# Patient Record
Sex: Female | Born: 2000 | Race: White | Hispanic: No | Marital: Single | Smoking: Never smoker
Health system: Southern US, Community
[De-identification: ages and names within clinical notes are randomized; demographics above are authoritative.]

## PROBLEM LIST (undated history)

## (undated) DIAGNOSIS — K219 Gastro-esophageal reflux disease without esophagitis: Secondary | ICD-10-CM

## (undated) DIAGNOSIS — T7840XA Allergy, unspecified, initial encounter: Secondary | ICD-10-CM

## (undated) DIAGNOSIS — R51 Headache: Secondary | ICD-10-CM

## (undated) DIAGNOSIS — R519 Headache, unspecified: Secondary | ICD-10-CM

---
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
## 2019-07-19 ENCOUNTER — Other Ambulatory Visit: Payer: Self-pay

## 2019-07-19 ENCOUNTER — Encounter: Payer: Self-pay | Admitting: Emergency Medicine

## 2019-07-19 ENCOUNTER — Ambulatory Visit
Admission: EM | Admit: 2019-07-19 | Discharge: 2019-07-19 | Disposition: A | Discharge status: Home / Self Care | Payer: 59 | Attending: Family Medicine | Admitting: Family Medicine

## 2019-07-19 DIAGNOSIS — R1030 Lower abdominal pain, unspecified: Secondary | ICD-10-CM

## 2019-07-19 DIAGNOSIS — M545 Low back pain: Secondary | ICD-10-CM

## 2019-07-19 DIAGNOSIS — Z3202 Encounter for pregnancy test, result negative: Secondary | ICD-10-CM

## 2019-07-19 LAB — URINALYSIS, COMPLETE (UACMP) WITH MICROSCOPIC
Bilirubin Urine: NEGATIVE
Glucose, UA: NEGATIVE mg/dL
Hgb urine dipstick: NEGATIVE
Ketones, ur: NEGATIVE mg/dL
Leukocytes,Ua: NEGATIVE
Nitrite: NEGATIVE
Protein, ur: NEGATIVE mg/dL
Specific Gravity, Urine: 1.015 (ref 1.005–1.030)
pH: 7 (ref 5.0–8.0)

## 2019-07-19 LAB — CBC WITH DIFFERENTIAL/PLATELET
Abs Immature Granulocytes: 0.02 10*3/uL (ref 0.00–0.07)
Basophils Absolute: 0.1 10*3/uL (ref 0.0–0.1)
Basophils Relative: 1 %
Eosinophils Absolute: 0.3 10*3/uL (ref 0.0–0.5)
Eosinophils Relative: 5 %
HCT: 41 % (ref 36.0–46.0)
Hemoglobin: 13.4 g/dL (ref 12.0–15.0)
Immature Granulocytes: 0 %
Lymphocytes Relative: 47 %
Lymphs Abs: 2.8 10*3/uL (ref 0.7–4.0)
MCH: 28.6 pg (ref 26.0–34.0)
MCHC: 32.7 g/dL (ref 30.0–36.0)
MCV: 87.6 fL (ref 80.0–100.0)
Monocytes Absolute: 0.4 10*3/uL (ref 0.1–1.0)
Monocytes Relative: 7 %
Neutro Abs: 2.4 10*3/uL (ref 1.7–7.7)
Neutrophils Relative %: 40 %
Platelets: 150 10*3/uL (ref 150–400)
RBC: 4.68 MIL/uL (ref 3.87–5.11)
RDW: 13.8 % (ref 11.5–15.5)
WBC: 6 10*3/uL (ref 4.0–10.5)
nRBC: 0 % (ref 0.0–0.2)

## 2019-07-19 LAB — COMPREHENSIVE METABOLIC PANEL
ALT: 26 U/L (ref 0–44)
AST: 23 U/L (ref 15–41)
Albumin: 5 g/dL (ref 3.5–5.0)
Alkaline Phosphatase: 71 U/L (ref 38–126)
Anion gap: 12 (ref 5–15)
BUN: 15 mg/dL (ref 6–20)
CO2: 23 mmol/L (ref 22–32)
Calcium: 9.7 mg/dL (ref 8.9–10.3)
Chloride: 102 mmol/L (ref 98–111)
Creatinine, Ser: 0.82 mg/dL (ref 0.44–1.00)
GFR calc Af Amer: >60 mL/min (ref >60)
GFR calc non Af Amer: >60 mL/min (ref >60)
Glucose, Bld: 85 mg/dL (ref 70–99)
Potassium: 3.9 mmol/L (ref 3.5–5.1)
Sodium: 137 mmol/L (ref 135–145)
Total Bilirubin: 1 mg/dL (ref 0.3–1.2)
Total Protein: 7.4 g/dL (ref 6.5–8.1)

## 2019-07-19 LAB — PREGNANCY, URINE: Preg Test, Ur: NEGATIVE

## 2019-07-19 LAB — WET PREP, GENITAL
Clue Cells Wet Prep HPF POC: NONE SEEN
Sperm: NONE SEEN
Trich, Wet Prep: NONE SEEN
Yeast Wet Prep HPF POC: NONE SEEN

## 2019-07-19 MED ORDER — POLYETHYLENE GLYCOL 3350 17 GM/SCOOP PO POWD: ORAL | 0 refills | Status: AC

## 2019-07-19 NOTE — ED Triage Notes (Signed)
Pt c/o lower mid abdominal pain, lower back pain. Started about 3 days ago. She has had nausea, vomiting (X1). Also having white vaginal discharge. denies fever, urinary symptoms. Pt has not had a period in 3 months but has never been sexually active.

## 2019-07-19 NOTE — Discharge Instructions (Signed)
Miralax as directed.  Eat small, frequent meals.  Consider seeing GI.  Take care  Dr. Lacinda Axon

## 2019-07-20 NOTE — ED Provider Notes (Signed)
[facility]    CSN: [id number] Arrival date & time: 07/19/19  1522   History   Chief Complaint Chief Complaint  Patient presents with  . Abdominal Pain   HPI  18 year old female presents with abdominal pain and back pain.  Patient reports a 3-day history of diffuse lower abdominal pain.  Associated back pain.  She reports associated nausea and one episode of emesis.  Denies fever.  Denies urinary symptoms.  Patient states that she is not sexually active and never has been.  She reports having some vaginal discharge.  Last menstrual cycle was 3 months ago.  She states that she has never had regular menstrual cycles.  Exercises frequently.  Mother was available via phone during history per patient request.  Mother states that she eats very little.  Mother believes that she has lost weight.  Patient states that she has only ate one small meal today.  Patient is concerned about her symptomatology.  Mother is also concerned.  Mother requesting blood work today.  No known inciting factor.  No known exacerbating or relieving factors.  No other complaints.  Past Medical History:  Diagnosis Date  . Allergy    pollen  . GERD (gastroesophageal reflux disease)   . Headache    sinuses   Past Surgical History:  Procedure Laterality Date  . ETHMOIDECTOMY Left 04/28/2015   Procedure: ETHMOIDECTOMY;  Surgeon: Vernie Murders, MD;  Location: [facility];  Service: ENT;  Laterality: Left;  . EXCISION CHONCHA BULLOSA Bilateral 04/28/2015   Procedure: EXCISION CHONCHA BULLOSA;  Surgeon: Vernie Murders, MD;  Location: [facility];  Service: ENT;  Laterality: Bilateral;  . FRONTAL SINUS EXPLORATION Bilateral 04/28/2015   Procedure: FRONTAL SINUS EXPLORATION;  Surgeon: Vernie Murders, MD;  Location: [facility];  Service: ENT;  Laterality: Bilateral;  . IMAGE GUIDED SINUS SURGERY N/A 04/28/2015   Procedure: IMAGE GUIDED SINUS SURGERY;  Surgeon: Vernie Murders, MD;  Location: [facility];  Service: ENT;  Laterality: N/A;  GAVE DISK TO CECE 8-10 KP  . MAXILLARY ANTROSTOMY Left 04/28/2015   Procedure: MAXILLARY ANTROSTOMY;  Surgeon: Vernie Murders, MD;  Location: [facility];  Service: ENT;  Laterality: Left;  . SEPTOPLASTY N/A 04/28/2015   Procedure: SEPTOPLASTY;  Surgeon: Vernie Murders, MD;  Location: [facility];  Service: ENT;  Laterality: N/A;    OB History   No obstetric history on file.      Home Medications    Prior to Admission medications   Medication Sig Start Date End Date Taking? Authorizing Provider  polyethylene glycol powder (GLYCOLAX/MIRALAX) 17 GM/SCOOP powder 17 g once or twice daily for constipation. 07/19/19   Tommie Sams, DO  cetirizine (ZYRTEC) 10 MG tablet Take 10 mg by mouth daily.  07/19/19  [provider]  norethindrone-ethinyl estradiol (JUNEL FE,GILDESS FE,LOESTRIN FE) 1-20 MG-MCG tablet Take 1 tablet by mouth daily.  07/19/19  [provider]  triamcinolone (NASACORT) 55 MCG/ACT AERO nasal inhaler Place 2 sprays into the nose daily.  07/19/19  [provider]   Family Hx: IBS - Mother  Social History Social History   Tobacco Use  . Smoking status: Never Smoker  . Smokeless tobacco: Never Used  Substance Use Topics  . Alcohol use: No  . Drug use: No     Allergies   Red dye   Review of Systems Review of Systems  Constitutional: Positive for appetite change. Negative for fever.  Gastrointestinal: Positive for abdominal pain, nausea and  vomiting.  Musculoskeletal: Positive for back pain.   Physical Exam Triage Vital Signs ED Triage Vitals  Enc Vitals Group     BP 07/19/19 1542 110/71     Pulse Rate 07/19/19 1542 (!) 50     Resp 07/19/19 1542 16     Temp 07/19/19 1542 (!) 97.5 F (36.4 C)     Temp Source 07/19/19 1542 Oral     SpO2 07/19/19 1542 100 %     Weight 07/19/19 1539 145 lb (65.8 kg)     Height 07/19/19 1539 5\' 11"  (1.803 m)     Head Circumference --       Peak Flow --      Pain Score 07/19/19 1537 6     Pain Loc --      Pain Edu? --      Excl. in [place]? --    No data found.  Updated Vital Signs BP 110/71 (BP Location: Left Arm)   Pulse (!) 50   Temp (!) 97.5 F (36.4 C) (Oral)   Resp 16   Ht 5\' 11"  (1.803 m)   Wt 65.8 kg   LMP 04/17/2019 (Approximate)   SpO2 100%   BMI 20.22 kg/m   Visual Acuity Right Eye Distance:   Left Eye Distance:   Bilateral Distance:    Right Eye Near:   Left Eye Near:    Bilateral Near:     Physical Exam Vitals signs and nursing note reviewed.  Constitutional:      General: She is not in acute distress.    Appearance: Normal appearance. She is not ill-appearing.  HENT:     Head: Normocephalic and atraumatic.     Nose: Nose normal.     Mouth/Throat:     Pharynx: Oropharynx is clear. No oropharyngeal exudate or posterior oropharyngeal erythema.  Eyes:     General:        Right eye: No discharge.        Left eye: No discharge.     Conjunctiva/sclera: Conjunctivae normal.  Neck:     Musculoskeletal: Neck supple.  Cardiovascular:     Rate and Rhythm: Regular rhythm. Bradycardia present.  Pulmonary:     Effort: Pulmonary effort is normal.     Breath sounds: Normal breath sounds. No wheezing, rhonchi or rales.  Abdominal:     General: There is no distension.     Palpations: Abdomen is soft.     Comments: Tenderness around the umbilicus and throughout the lower abdomen.  Lymphadenopathy:     Cervical: No cervical adenopathy.  Skin:    General: Skin is warm.     Findings: No rash.  Neurological:     General: No focal deficit present.     Mental Status: She is alert and oriented to person, place, and time.  Psychiatric:        Mood and Affect: Mood normal.        Behavior: Behavior normal.    UC Treatments / Results  Labs (all labs ordered are listed, but only abnormal results are displayed) Labs Reviewed  WET PREP, GENITAL - Abnormal; Notable for the following components:       Result Value   WBC, Wet Prep HPF POC MODERATE (*)    All other components within normal limits  URINALYSIS, COMPLETE (UACMP) WITH MICROSCOPIC - Abnormal; Notable for the following components:   Color, Urine STRAW (*)    Bacteria, UA FEW (*)    All other components within normal  limits  PREGNANCY, URINE  CBC WITH DIFFERENTIAL/PLATELET  COMPREHENSIVE METABOLIC PANEL    EKG   Radiology No results found.  Procedures Procedures (including critical care time)  Medications Ordered in UC Medications - No data to display  Initial Impression / Assessment and Plan / UC Course  I have reviewed the triage vital signs and the nursing notes.  Pertinent labs & imaging results that were available during my care of the patient were reviewed by me and considered in my medical decision making (see chart for details).    18 year old female presents with ongoing abdominal pain and back pain.  Urinalysis not consistent with UTI.  Patient has no urinary symptoms.  Wet prep negative.  Laboratory studies obtained and were normal. All findings reassuring. Advised PRN Miralax (has BM's approximately 2 times a week).   Recommended seeing GI.   Final Clinical Impressions(s) / UC Diagnoses   Final diagnoses:  Lower abdominal pain     Discharge Instructions     Miralax as directed.  Eat small, frequent meals.  Consider seeing GI.  Take care  Dr. Adriana Simasook    ED Prescriptions    Medication Sig Dispense Auth. Provider   polyethylene glycol powder (GLYCOLAX/MIRALAX) 17 GM/SCOOP powder 17 g once or twice daily for constipation. 500 g Tommie Samsook, Isaiha Asare G, DO     PDMP not reviewed this encounter.   Tommie SamsCook, Takhia Spoon G, [place]DO 07/20/19 1349

## 2019-07-22 ENCOUNTER — Encounter: Payer: Self-pay | Admitting: Gastroenterology

## 2019-07-22 ENCOUNTER — Ambulatory Visit (INDEPENDENT_AMBULATORY_CARE_PROVIDER_SITE_OTHER): Payer: 59 | Admitting: Gastroenterology

## 2019-07-22 ENCOUNTER — Other Ambulatory Visit (INDEPENDENT_AMBULATORY_CARE_PROVIDER_SITE_OTHER): Payer: 59

## 2019-07-22 VITALS — BP 86/60 | HR 51 | Temp 97.4°F | Ht 71.0 in | Wt 147.0 lb

## 2019-07-22 DIAGNOSIS — K5909 Other constipation: Secondary | ICD-10-CM

## 2019-07-22 DIAGNOSIS — R1030 Lower abdominal pain, unspecified: Secondary | ICD-10-CM

## 2019-07-22 LAB — T4, FREE: Free T4: 0.87 ng/dL (ref 0.60–1.60)

## 2019-07-22 LAB — TSH: TSH: 2.4 u[IU]/mL (ref 0.40–5.00)

## 2019-07-22 NOTE — Progress Notes (Signed)
[facility] Consult Note:  History: Laura Gilbert 07/22/2019  Referring provider: [facility], [place]  Reason for consult/chief complaint: Constipation (pt reports stools that are normal but infrequent; also reports bloating and gas; sometimes feels like she has to have a bm but doesn't; has lower abdominal pain after eating; )   Subjective  HPI:  This is a very pleasant 18 year old woman self-referred and accompanied by her mother for several months of constipation and abdominal pain.  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  Over the summer she began having slowly worsening constipation, where she might go up to a week without a BM.  This would cause crampy lower abdominal pain bloating and intermittent nausea without vomiting.  The pain is worse after eating, and she denies rectal bleeding. They went to an urgent care near their home where they sometimes get primary care, since their main primary care provider is in [place].  She had some lab work, pregnancy test and a urinalysis done there lately, we do not have those records.  MiraLAX was started last week without much improvement so far. Also, she has not had her menses in a few months, and says she is not sexually active so could not be pregnant. 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 She will feel the need for BM wraps every couple of days, but will sit and feel like nothing will pass.   ROS:  Review of Systems  Constitutional: Negative for appetite change and unexpected weight change.  HENT: Negative for mouth sores and voice change.   Eyes: Negative  for pain and redness.  Respiratory: Negative for cough and shortness of breath.   Cardiovascular: Negative for chest pain and palpitations.  Genitourinary: Negative for dysuria and hematuria.       Cessation of menses in the last few months as noted above  Musculoskeletal: Negative for arthralgias and myalgias.  Skin: Negative for pallor and rash.  Neurological: Negative for weakness and headaches.  Hematological: Negative for adenopathy.     Past Medical History: Past Medical History:  Diagnosis Date  . Allergy    pollen  . GERD (gastroesophageal reflux disease)   . Headache    sinuses     Past Surgical History: Past Surgical History:  Procedure Laterality Date  . ETHMOIDECTOMY Left 04/28/2015   Procedure: ETHMOIDECTOMY;  Surgeon: Margaretha Sheffield, MD;  Location: [place];  Service: ENT;  Laterality: Left;  . EXCISION CHONCHA BULLOSA Bilateral 04/28/2015   Procedure: EXCISION CHONCHA BULLOSA;  Surgeon: Margaretha Sheffield, MD;  Location: [place];  Service: ENT;  Laterality: Bilateral;  . FRONTAL SINUS EXPLORATION Bilateral 04/28/2015   Procedure: FRONTAL SINUS EXPLORATION;  Surgeon: Margaretha Sheffield, MD;  Location: [place];  Service: ENT;  Laterality: Bilateral;  . IMAGE GUIDED SINUS SURGERY N/A 04/28/2015   Procedure: IMAGE GUIDED SINUS SURGERY;  Surgeon: Margaretha Sheffield, MD;  Location: [place];  Service: ENT;  Laterality: N/A;  GAVE DISK TO CE[facility] 8-10 KP  . MAXILLARY ANTROSTOMY Left 04/28/2015   Procedure: MAXILLARY ANTROSTOMY;  Surgeon: Margaretha Sheffield, MD;  Location: [place];  Service: ENT;  Laterality: Left;  . SEPTOPLASTY N/A 04/28/2015   Procedure: SEPTOPLASTY;  Surgeon: Vernie Murders, MD;  Location: [facility];  Service: ENT;  Laterality: N/A;     Family History: History reviewed. No pertinent family history. Mother has IBS  Social History: Social History   Socioeconomic History  . Marital status: Single    Spouse name:  Not on file  . Number of children: Not on file  . Years of education: Not on file  . Highest education level: Not on file  Occupational History  . Not on file  Social Needs  . Financial resource strain: Not on file  . Food insecurity    Worry: Not on file    Inability: Not on file  . Transportation needs    Medical: Not on file    Non-medical: Not on file  Tobacco Use  . Smoking status: Never Smoker  . Smokeless tobacco: Never Used  Substance and Sexual Activity  . Alcohol use: No  . Drug use: No  . Sexual activity: Not on file  Lifestyle  . Physical activity    Days per week: Not on file    Minutes per session: Not on file  . Stress: Not on file  Relationships  . Social Musician on phone: Not on file    Gets together: Not on file    Attends religious service: Not on file    Active member of club or organization: Not on file    Attends meetings of clubs or organizations: Not on file    Relationship status: Not on file  Other Topics Concern  . Not on file  Social History Narrative  . Not on file    Allergies: Allergies  Allergen Reactions  . Red Dye Other (See Comments)    Aggitation, anger    Outpatient Meds: Current Outpatient Medications  Medication Sig Dispense Refill  . polyethylene glycol powder (GLYCOLAX/MIRALAX) 17 GM/SCOOP powder 17 g once or twice daily for constipation. 500 g 0  . Probiotic Product (PROBIOTIC DAILY PO) Take 1 capsule by mouth daily.     No current facility-administered medications for this visit.       ___________________________________________________________________ Objective   Exam:  BP (!) 86/60   Pulse (!) 51   Temp (!) 97.4 F (36.3 C)   Ht 5\' 11"  (1.803 m)   Wt 147 lb (66.7 kg)   BMI 20.50 kg/m  Her mother was present for the entire visit, including physical exam.  General: Well-appearing, pleasant and conversational.  Eyes: sclera anicteric, no redness  ENT: oral mucosa moist without lesions,  no cervical or supraclavicular lymphadenopathy  CV: RRR without murmur, S1/S2, no JVD, no peripheral edema  Resp: clear to auscultation bilaterally, normal RR and effort noted  GI: soft, lower tenderness, with active bowel sounds. No guarding or palpable organomegaly noted.  No distention   skin; warm and dry, no rash or jaundice noted  Neuro: awake, alert and oriented x 3. Normal gross motor function and fluent speech Rectal: Normal external.  Normal sphincter tone without tenderness, no fissure or palpable internal lesion.  When asked to constrict muscles to hold in bowel movement, she would bear down, and vice versa. Normal sphincter contraction was felt as well as puborectalis.  She has rectal descent with Valsalva.   Assessment: Encounter Diagnoses  Name Primary?  . Chronic constipation Yes  . Lower abdominal pain     This seems most likely to be  functional abdominal pain and constipation, possibly with an element of dyssynergy defecation.  Sounds like there may be a significant stress component, which might also account for her recent loss of menses.  Strongly recommended she consult with her primary care provider about that.  Plan: TSH and free T4 Magnesium citrate bowel purge 2 days later, start samples of Zelnorm 6 mg once a day. Call in a week with a symptom update.  If relief of constipation but no improvement in abdominal pain with follow-up, possibly pursue CT scan abdomen and pelvis with repeat pregnancy test beforehand.  Total time 60 minutes, over half spent face-to-face.    Charlie PitterHenry L Danis III  CC: Referring provider noted above

## 2019-07-22 NOTE — Patient Instructions (Signed)
If you are age 19 or older, your body mass index should be between 23-30. Your Body mass index is 20.5 kg/m. If this is out of the aforementioned range listed, please consider follow up with your Primary Care Provider.  If you are age 59 or younger, your body mass index should be between 19-25. Your Body mass index is 20.5 kg/m. If this is out of the aformentioned range listed, please consider follow up with your Primary Care Provider.   Your provider has requested that you go to the basement level for lab work before leaving today. Press "B" on the elevator. The lab is located at the first door on the left as you exit the elevator.  Purchase over the counter Magnesium citrate, 1 bottle Drink 1/2 a bottle tonight  Drink 1/2 bottle Thursday morning.  Friday start Zelnorm, 1 tablet every morning.  Medication Samples have been provided to the patient.  Drug name: ZELNORM       Strength: 6mg          Qty: 30  LOT: [id number]  Exp.Date: 04-2020  Dosing instructions: Start with one tablet a day  Call in a week with an update ask for [facility] RN or send [place] a [employer]. [phone].   The patient has been instructed regarding the correct time, dose, and frequency of taking this medication, including desired effects and most common side effects.   Elias Else 9:50 AM 07/22/2019

## 2019-07-27 ENCOUNTER — Ambulatory Visit: Payer: 59 | Admitting: Nurse Practitioner

## 2019-07-29 ENCOUNTER — Telehealth: Payer: Self-pay | Admitting: Gastroenterology

## 2019-07-29 NOTE — Telephone Encounter (Signed)
Left message to call back  

## 2019-07-31 NOTE — Telephone Encounter (Signed)
Left 2nd message for the patient to call back.  

## 2019-08-04 NOTE — Telephone Encounter (Signed)
Called the patient for the third time, the call was forwarded to VM. This RN left third message for the patient to call back.

## 2022-09-12 ENCOUNTER — Ambulatory Visit (INDEPENDENT_AMBULATORY_CARE_PROVIDER_SITE_OTHER): Payer: 59 | Admitting: Podiatry

## 2022-09-12 ENCOUNTER — Other Ambulatory Visit: Payer: Self-pay | Admitting: Podiatry

## 2022-09-12 ENCOUNTER — Encounter: Payer: Self-pay | Admitting: Podiatry

## 2022-09-12 DIAGNOSIS — B351 Tinea unguium: Secondary | ICD-10-CM | POA: Diagnosis not present

## 2022-09-12 MED ORDER — TERBINAFINE HCL 250 MG PO TABS: 250.0000 mg | ORAL_TABLET | Freq: Every day | ORAL | 0 refills | Status: AC

## 2022-09-12 NOTE — Progress Notes (Signed)
Subjective:   Patient ID: Laura Gilbert, female   DOB: 21 y.o.   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  Patient's not been seen for a number of years has had nail trauma in the past and used to play tennis and basketball.  Patient does not smoke likes to be active   Review of Systems  All other systems reviewed and are negative.       Objective:  Physical Exam Vitals and nursing note reviewed.  Constitutional:      Appearance: She is well-developed.  Pulmonary:     Effort: Pulmonary effort is normal.  Musculoskeletal:        General: Normal range of motion.  Skin:    General: Skin is warm.  Neurological:     Mental Status: She is alert.     Neurovascular status intact muscle strength adequate range of motion adequate with patient found to have thick yellow brittle nailbeds 1-5 left foot and also some skin changes with flakiness.  Right foot appears normal currently.  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  I am ordering blood work to evaluate liver function I do think Lamisil for her is her best option to try to improve this condition.  Patient wants to go this route and is placed on Lamisil 2 and 50 mg for 3 months understanding this is all we will do except for may be posttreatment future.  I would look at the results of lab work may decide on something else depending on response and all risk factors discussed with patient and mother

## 2022-09-13 LAB — HEPATIC FUNCTION PANEL
ALT: 12 IU/L (ref 0–32)
AST: 18 IU/L (ref 0–40)
Albumin: 4.9 g/dL (ref 4.0–5.0)
Alkaline Phosphatase: 84 IU/L (ref 44–121)
Bilirubin Total: 0.7 mg/dL (ref 0.0–1.2)
Bilirubin, Direct: 0.16 mg/dL (ref 0.00–0.40)
Total Protein: 7.1 g/dL (ref 6.0–8.5)
